# Patient Record
Sex: Male | Born: 1985 | Race: White | Hispanic: No | Marital: Single | Smoking: Light tobacco smoker
Health system: Southern US, Community
[De-identification: ages and names within clinical notes are randomized; demographics above are authoritative.]

---

## 2016-01-05 ENCOUNTER — Encounter (HOSPITAL_BASED_OUTPATIENT_CLINIC_OR_DEPARTMENT_OTHER): Payer: Self-pay | Admitting: *Deleted

## 2016-01-05 ENCOUNTER — Emergency Department (HOSPITAL_BASED_OUTPATIENT_CLINIC_OR_DEPARTMENT_OTHER): Payer: Medicare Other

## 2016-01-05 ENCOUNTER — Emergency Department (HOSPITAL_BASED_OUTPATIENT_CLINIC_OR_DEPARTMENT_OTHER)
Admission: EM | Admit: 2016-01-05 | Discharge: 2016-01-05 | Disposition: A | Discharge status: Home / Self Care | Payer: Medicare Other | Attending: Emergency Medicine | Admitting: Emergency Medicine

## 2016-01-05 DIAGNOSIS — R1013 Epigastric pain: Secondary | ICD-10-CM | POA: Diagnosis not present

## 2016-01-05 DIAGNOSIS — J209 Acute bronchitis, unspecified: Secondary | ICD-10-CM | POA: Diagnosis not present

## 2016-01-05 DIAGNOSIS — R05 Cough: Secondary | ICD-10-CM | POA: Diagnosis present

## 2016-01-05 DIAGNOSIS — R1110 Vomiting, unspecified: Secondary | ICD-10-CM | POA: Diagnosis not present

## 2016-01-05 DIAGNOSIS — F17200 Nicotine dependence, unspecified, uncomplicated: Secondary | ICD-10-CM | POA: Diagnosis not present

## 2016-01-05 LAB — RAPID STREP SCREEN (MED CTR MEBANE ONLY): Streptococcus, Group A Screen (Direct): NEGATIVE

## 2016-01-05 MED ORDER — ALBUTEROL SULFATE HFA 108 (90 BASE) MCG/ACT IN AERS
2.0000 | INHALATION_SPRAY | RESPIRATORY_TRACT | Status: DC | PRN
  Administered 2016-01-05: 2 via RESPIRATORY_TRACT
  Filled 2016-01-05: qty 6.7

## 2016-01-05 MED ORDER — HYDROCOD POLST-CPM POLST ER 10-8 MG/5ML PO SUER
5.0000 mL | Freq: Once | ORAL | Status: AC
  Administered 2016-01-05: 5 mL via ORAL
  Filled 2016-01-05: qty 5

## 2016-01-05 MED ORDER — IPRATROPIUM-ALBUTEROL 0.5-2.5 (3) MG/3ML IN SOLN
3.0000 mL | RESPIRATORY_TRACT | Status: DC
  Administered 2016-01-05: 3 mL via RESPIRATORY_TRACT
  Filled 2016-01-05: qty 3

## 2016-01-05 MED ORDER — HYDROCOD POLST-CPM POLST ER 10-8 MG/5ML PO SUER: 5.0000 mL | Freq: Two times a day (BID) | ORAL | Status: AC | PRN

## 2016-01-05 NOTE — ED Provider Notes (Signed)
CSN: [id number]     Arrival date & time 01/05/16  0258 History   First MD Initiated Contact with Patient 01/05/16 [phone]     Chief Complaint  Patient presents with  . Cough     (Consider location/radiation/quality/duration/timing/severity/associated sxs/prior Treatment) HPI  This is a 30 year old male with a one-day history of cough, chest congestion, subjective fever, sore throat and vomiting. He vomited 4 times yesterday after eating at [employer]. He is not sure if the vomiting was due to posttussive emesis. He has had wheezing and is having epigastric pain which he attributes to the vomiting. He has taken DayQuil without adequate relief. He is no longer feeling nauseated.  History reviewed. No pertinent past medical history. History reviewed. No pertinent past surgical history. No family history on file. Social History  Substance Use Topics  . Smoking status: Light Tobacco Smoker  . Smokeless tobacco: None  . Alcohol Use: No    Review of Systems  All other systems reviewed and are negative.   Allergies  Flexeril  Home Medications   Prior to Admission medications   Not on File   BP 125/73 mmHg  Pulse 85  Temp(Src) 98.4 F (36.9 C)  Resp 18  Ht 6' (1.829 m)  Wt 247 lb 3 oz (112.124 kg)  BMI 33.52 kg/m2  SpO2 95%   Physical Exam  General: Well-developed, well-nourished male in no acute distress; appearance consistent with age of record HENT: normocephalic; atraumatic; pharyngeal erythema without exudate Eyes: pupils equal, round and reactive to light; extraocular muscles intact Neck: supple Heart: regular rate and rhythm Lungs: Faint inspiratory and expiratory wheezes Abdomen: soft; nondistended; mild epigastric tenderness; no masses or hepatosplenomegaly; bowel sounds present Extremities: No deformity; full range of motion; pulses normal Neurologic: Awake, alert and oriented; motor function intact in all extremities and symmetric; no facial droop Skin: Warm and  dry Psychiatric: Normal mood and affect    ED Course  Procedures (including critical care time)   MDM  Nursing notes and vitals signs, including pulse oximetry, reviewed.  Summary of this visit's results, reviewed by myself:  Labs:  Results for orders placed or performed during the hospital encounter of 01/05/16 (from the past 24 hour(s))  Rapid strep screen     Status: None   Collection Time: 01/05/16  3:37 AM  Result Value Ref Range   Streptococcus, Group A Screen (Direct) NEGATIVE NEGATIVE    Imaging Studies: Dg Chest 2 View  01/05/2016  CLINICAL DATA:  Vomiting yesterday. Congestion and fever. Generalized weakness. EXAM: CHEST  2 VIEW COMPARISON:  None. FINDINGS: The heart size and mediastinal contours are within normal limits. Both lungs are clear. The visualized skeletal structures are unremarkable. IMPRESSION: No active cardiopulmonary disease. Electronically Signed   By: Burman Nieves M.D.   On: 01/05/2016 03:54   4:12 AM Air movement improved after DuoNeb treatment. Will discharge home on albuterol HFA. Antibiotics not indicated at this point as this is likely viral.    Paula Libra, MD 01/05/16 [phone]

## 2016-01-05 NOTE — ED Notes (Signed)
Vomiting times 4 yesterday after he ate at [facility].  States he felt like it was due to congestion Also states  he felt like he had a fever yesterday. C/o general weakness. C/o cough and anterior chest pain also from cough. States that he feels like his stomach is "burning" as well.

## 2016-01-05 NOTE — Discharge Instructions (Signed)

## 2016-01-08 LAB — CULTURE, GROUP A STREP (THRC)

## 2016-09-10 IMAGING — CR DG CHEST 2V
2 series · 2 of 2 positions shown · non-contrast
Comparison: None.

CLINICAL DATA: Vomiting yesterday. Congestion and fever.
Generalized weakness.

EXAM:
CHEST  2 VIEW

[w chest pa]
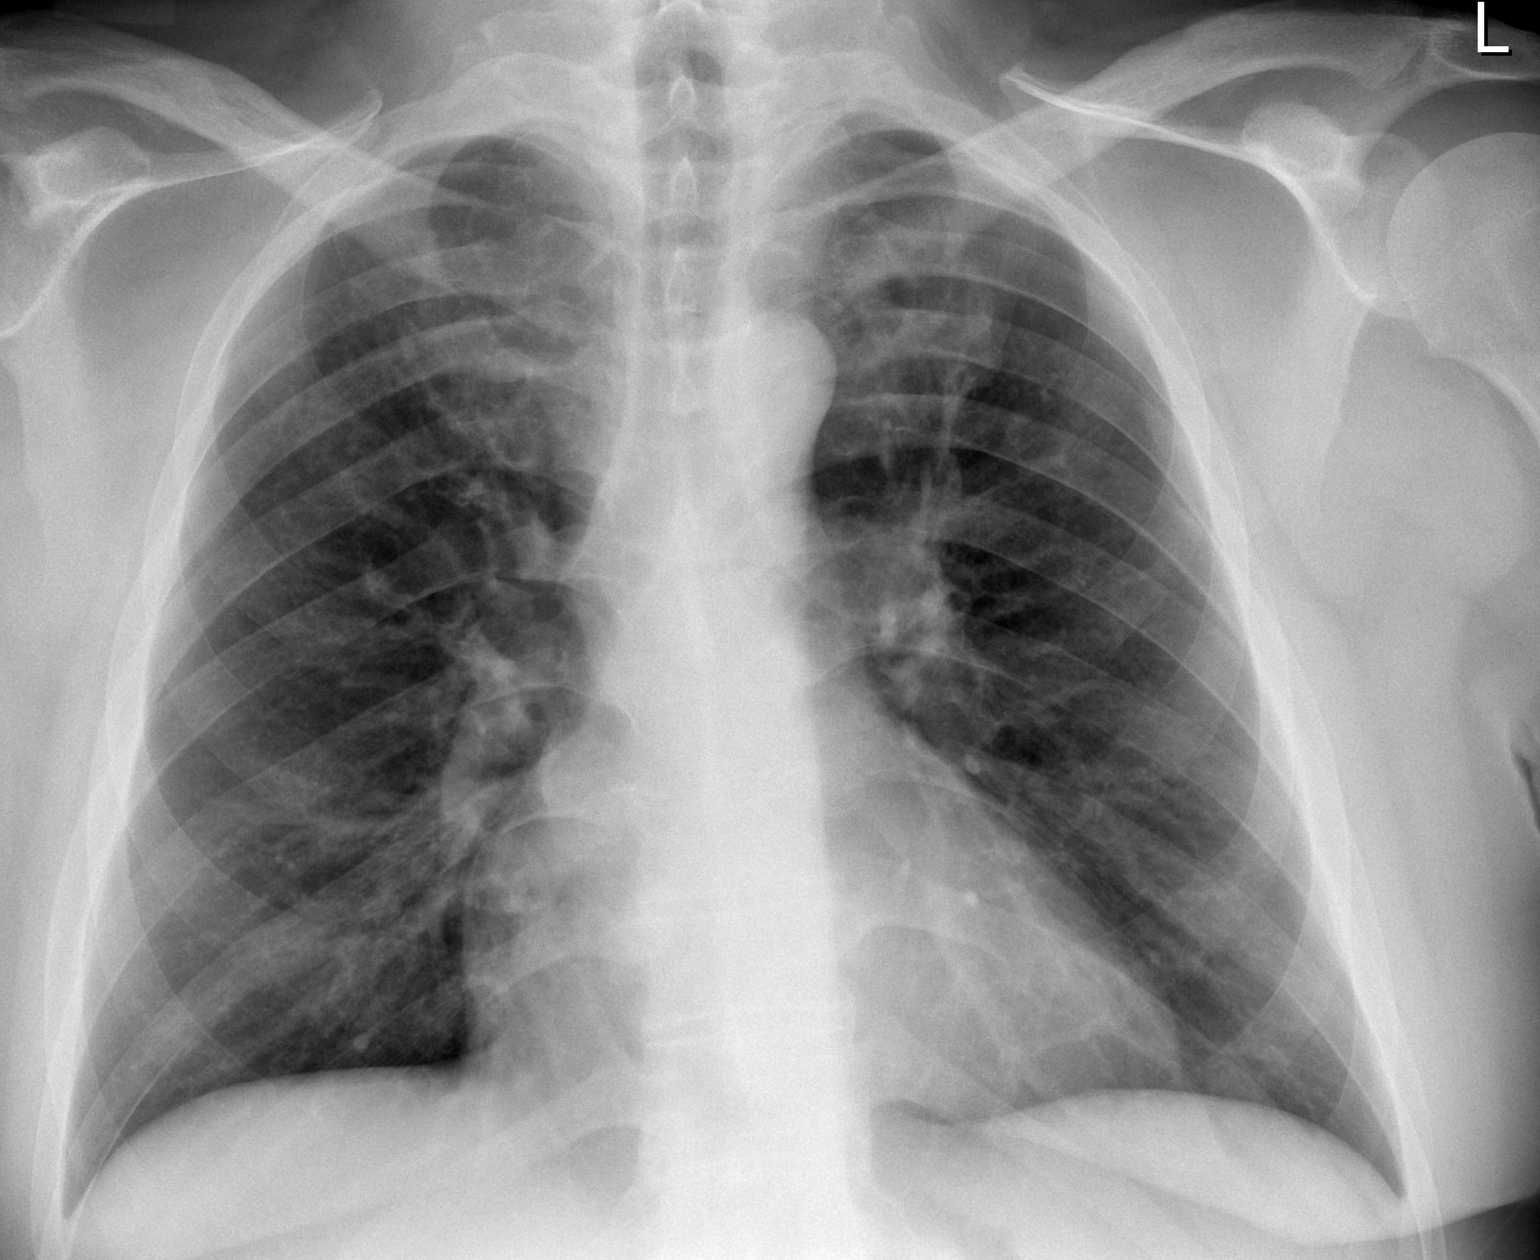

[w chest lat]
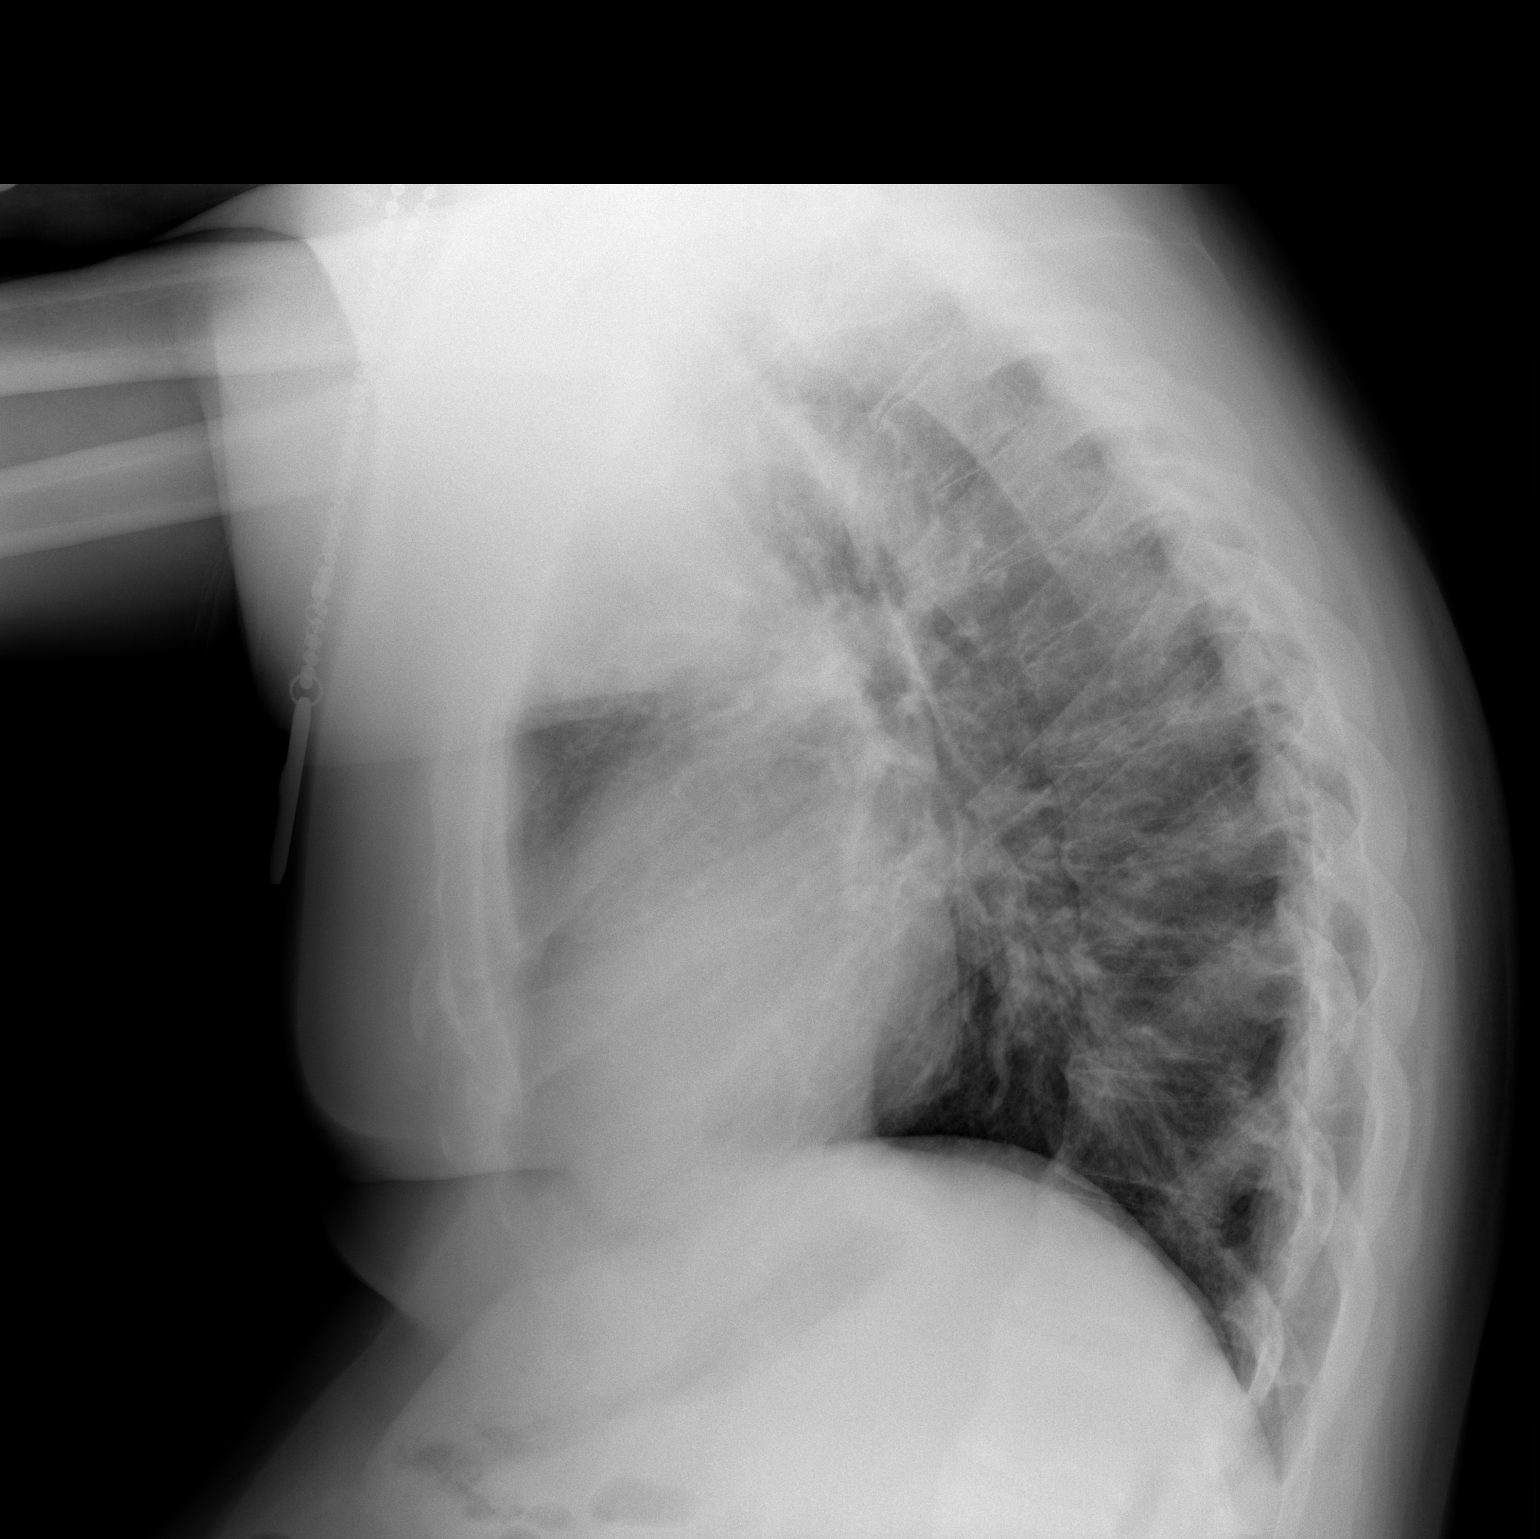

[2 of 2 positions shown; findings below may reference images not displayed]

FINDINGS: The heart size and mediastinal contours are within normal limits.
Both lungs are clear. The visualized skeletal structures are
unremarkable.
IMPRESSION: No active cardiopulmonary disease.
